# Patient Record
Sex: Male | Born: 1950 | Race: Black or African American | Hispanic: No | Marital: Single | Smoking: Never smoker
Health system: Southern US, Community
[De-identification: ages and names within clinical notes are randomized; demographics above are authoritative.]

---

## 2007-07-01 ENCOUNTER — Emergency Department (HOSPITAL_COMMUNITY): Admission: EM | Admit: 2007-07-01 | Discharge: 2007-07-01 | Payer: Self-pay | Admitting: Emergency Medicine

## 2007-09-22 ENCOUNTER — Emergency Department (HOSPITAL_COMMUNITY): Admission: EM | Admit: 2007-09-22 | Discharge: 2007-09-22 | Payer: Self-pay | Admitting: Emergency Medicine

## 2007-09-30 ENCOUNTER — Emergency Department (HOSPITAL_COMMUNITY): Admission: EM | Admit: 2007-09-30 | Discharge: 2007-09-30 | Payer: Self-pay | Admitting: Emergency Medicine

## 2007-11-26 ENCOUNTER — Inpatient Hospital Stay (HOSPITAL_COMMUNITY): Admission: EM | Admit: 2007-11-26 | Discharge: 2007-11-28 | Payer: Self-pay | Admitting: Emergency Medicine

## 2007-11-26 ENCOUNTER — Ambulatory Visit: Payer: Self-pay | Admitting: Infectious Diseases

## 2007-11-27 ENCOUNTER — Encounter (INDEPENDENT_AMBULATORY_CARE_PROVIDER_SITE_OTHER): Payer: Self-pay | Admitting: Emergency Medicine

## 2007-11-30 ENCOUNTER — Ambulatory Visit: Payer: Self-pay | Admitting: *Deleted

## 2007-12-19 ENCOUNTER — Ambulatory Visit: Payer: Self-pay | Admitting: Internal Medicine

## 2008-01-25 ENCOUNTER — Emergency Department (HOSPITAL_COMMUNITY): Admission: EM | Admit: 2008-01-25 | Discharge: 2008-01-25 | Payer: Self-pay | Admitting: Emergency Medicine

## 2008-02-05 ENCOUNTER — Ambulatory Visit: Payer: Self-pay | Admitting: Internal Medicine

## 2008-03-21 ENCOUNTER — Ambulatory Visit: Payer: Self-pay | Admitting: Internal Medicine

## 2008-04-06 ENCOUNTER — Encounter: Admission: RE | Admit: 2008-04-06 | Discharge: 2008-04-06 | Payer: Self-pay | Admitting: Diagnostic Radiology

## 2008-04-14 ENCOUNTER — Emergency Department (HOSPITAL_COMMUNITY): Admission: EM | Admit: 2008-04-14 | Discharge: 2008-04-14 | Payer: Self-pay | Admitting: Emergency Medicine

## 2008-05-10 ENCOUNTER — Ambulatory Visit: Payer: Self-pay | Admitting: Internal Medicine

## 2008-05-22 ENCOUNTER — Encounter: Admission: RE | Admit: 2008-05-22 | Discharge: 2008-07-25 | Payer: Self-pay | Admitting: Internal Medicine

## 2008-06-20 ENCOUNTER — Emergency Department (HOSPITAL_COMMUNITY): Admission: EM | Admit: 2008-06-20 | Discharge: 2008-06-20 | Payer: Self-pay | Admitting: Emergency Medicine

## 2008-08-12 ENCOUNTER — Ambulatory Visit: Payer: Self-pay | Admitting: Internal Medicine

## 2008-08-19 ENCOUNTER — Ambulatory Visit: Payer: Self-pay | Admitting: Internal Medicine

## 2008-08-22 ENCOUNTER — Ambulatory Visit: Payer: Self-pay | Admitting: Internal Medicine

## 2008-09-02 ENCOUNTER — Ambulatory Visit: Payer: Self-pay | Admitting: Internal Medicine

## 2008-09-19 ENCOUNTER — Ambulatory Visit: Payer: Self-pay | Admitting: Family Medicine

## 2008-11-04 ENCOUNTER — Ambulatory Visit: Payer: Self-pay | Admitting: Internal Medicine

## 2008-12-05 ENCOUNTER — Ambulatory Visit: Payer: Self-pay | Admitting: Internal Medicine

## 2008-12-05 LAB — CONVERTED CEMR LAB
AST: 32 units/L (ref 0–37)
Alkaline Phosphatase: 60 units/L (ref 39–117)
Basophils Relative: 0 % (ref 0–1)
CO2: 21 meq/L (ref 19–32)
Calcium: 10.1 mg/dL (ref 8.4–10.5)
Chloride: 101 meq/L (ref 96–112)
Cholesterol: 164 mg/dL (ref 0–200)
Hemoglobin: 15.1 g/dL (ref 13.0–17.0)
LDL Cholesterol: 98 mg/dL (ref 0–99)
MCHC: 31.9 g/dL (ref 30.0–36.0)
Monocytes Relative: 7 % (ref 3–12)
Neutrophils Relative %: 44 % (ref 43–77)
RBC: 4.82 M/uL (ref 4.22–5.81)
RDW: 13.8 % (ref 11.5–15.5)
TSH: 1.042 microintl units/mL (ref 0.350–4.50)
Total CHOL/HDL Ratio: 3.7
VLDL: 22 mg/dL (ref 0–40)

## 2009-03-14 ENCOUNTER — Emergency Department (HOSPITAL_COMMUNITY): Admission: EM | Admit: 2009-03-14 | Discharge: 2009-03-14 | Payer: Self-pay | Admitting: *Deleted

## 2009-03-18 ENCOUNTER — Ambulatory Visit: Payer: Self-pay | Admitting: Internal Medicine

## 2009-06-11 IMAGING — CT CT HEAD W/O CM
1 of 2 series · 16 of 30 positions shown, 20 images · non-contrast
Comparison: 07/01/2007

CLINICAL DATA: Headaches

CT HEAD WITHOUT CONTRAST
TECHNIQUE: Contiguous axial images were obtained from the base of
the skull through the vertex without contrast.

[Series 2: head routine 4.8 h37s · axial · 0.47mm/px · z∈[-118,+10]mm · 16 of 30 slices shown, 20 images]
[im 2/30  brain]
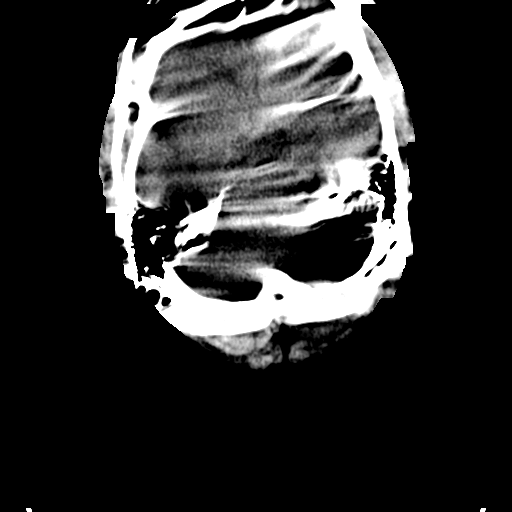
[im 2/30  bone]
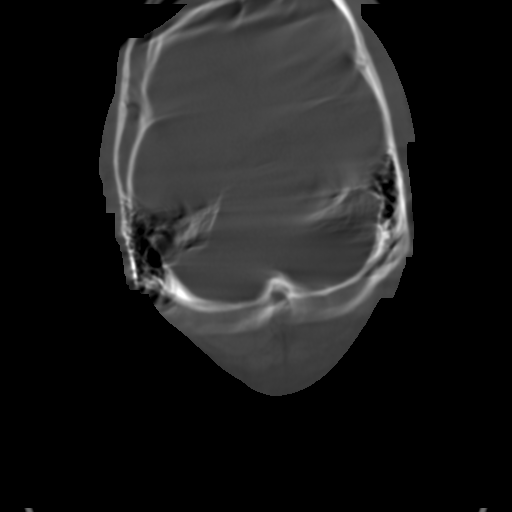
[im 3/30  brain]
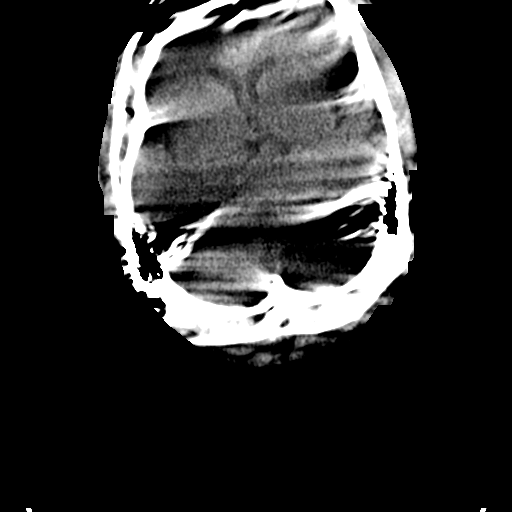
[im 5/30  brain]
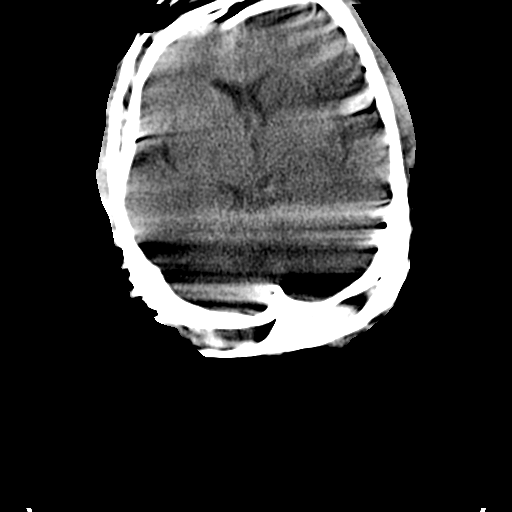
[im 8/30  brain]
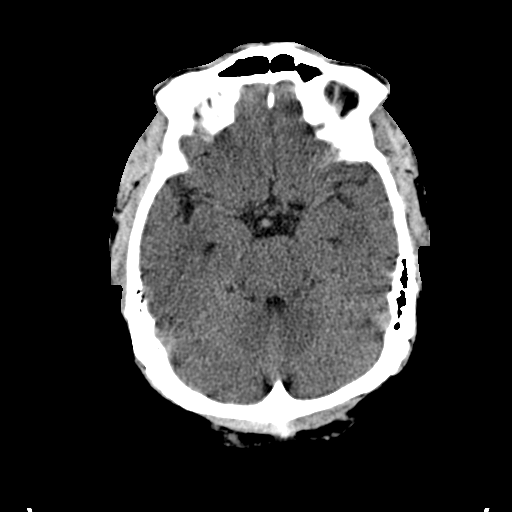
[im 9/30  brain]
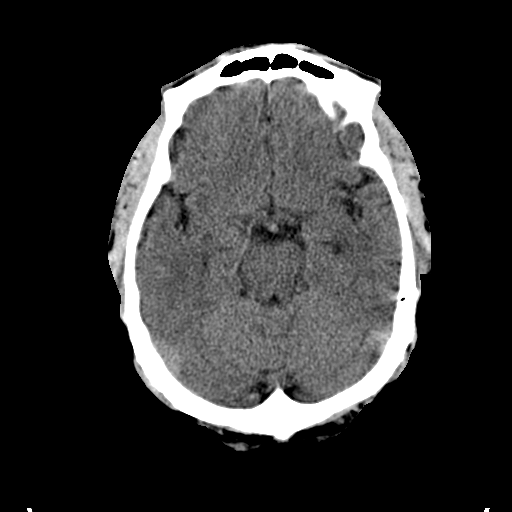
[im 9/30  bone]
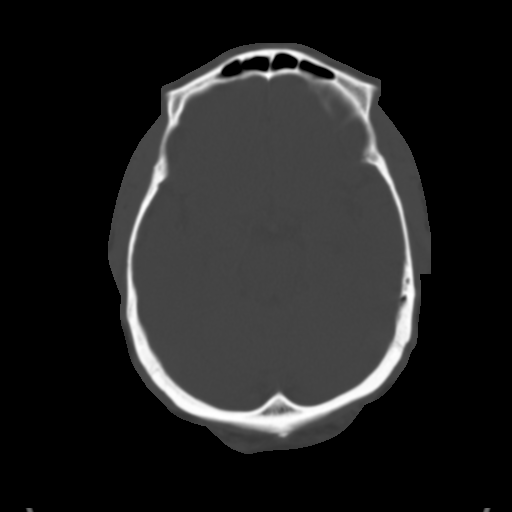
[im 11/30  brain]
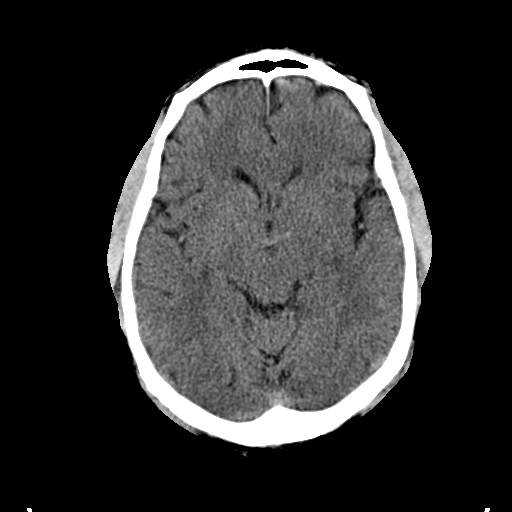
[im 12/30  brain]
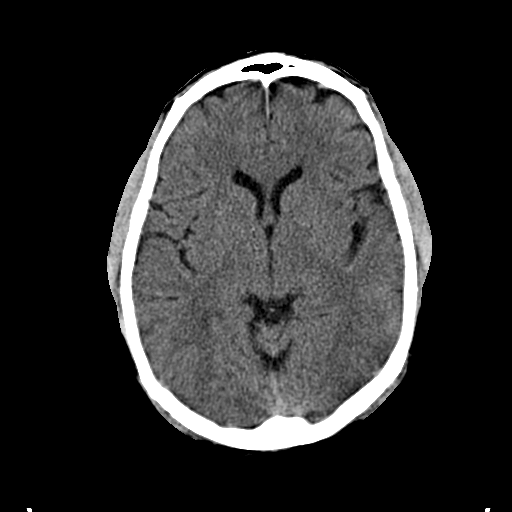
[im 14/30  brain]
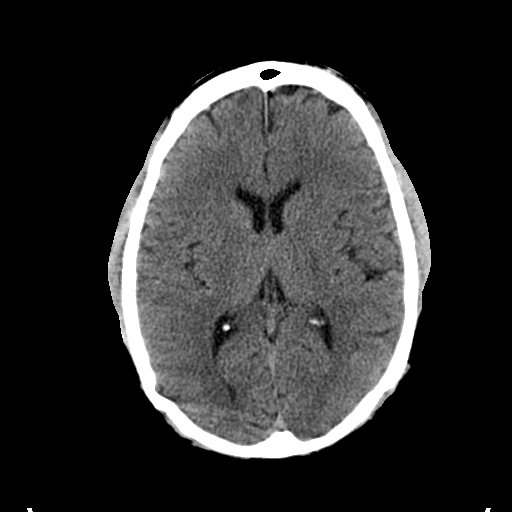
[im 16/30  brain]
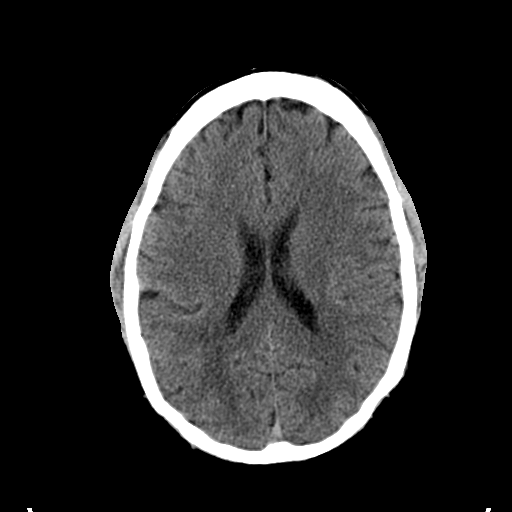
[im 16/30  bone]
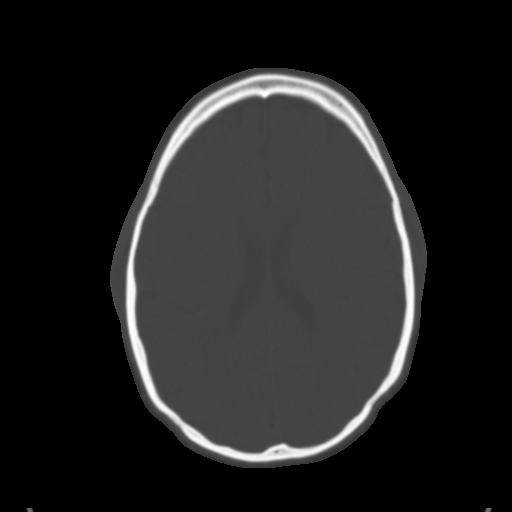
[im 18/30  brain]
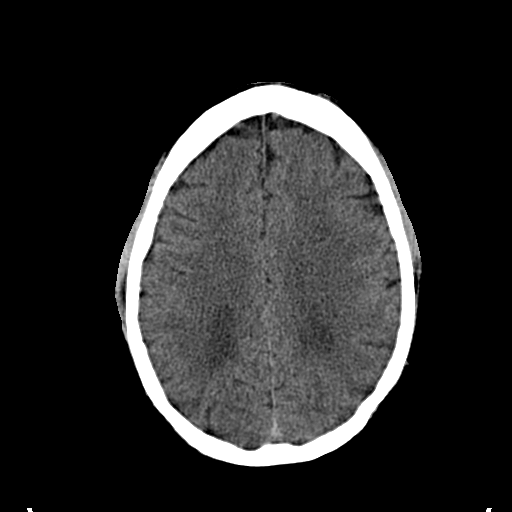
[im 19/30  brain]
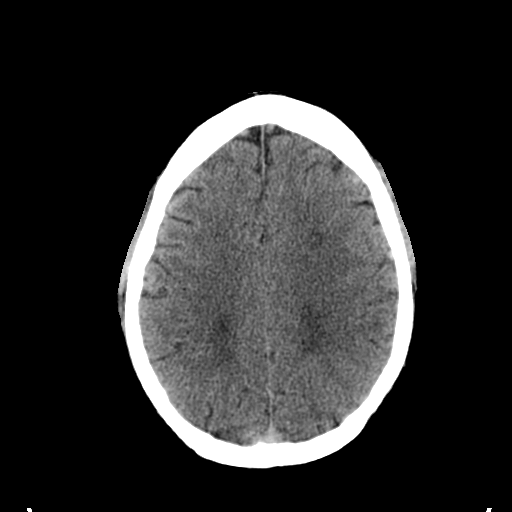
[im 21/30  brain]
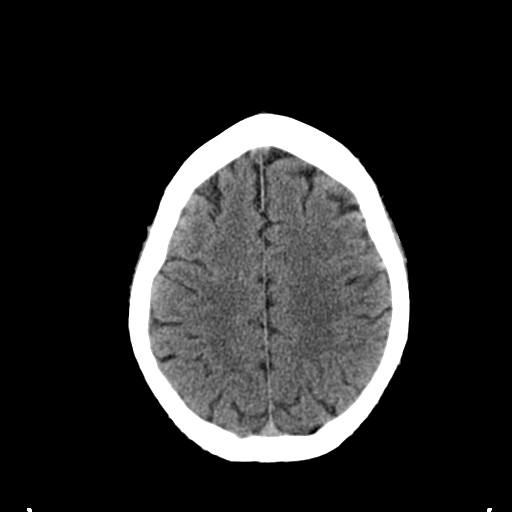
[im 22/30  brain]
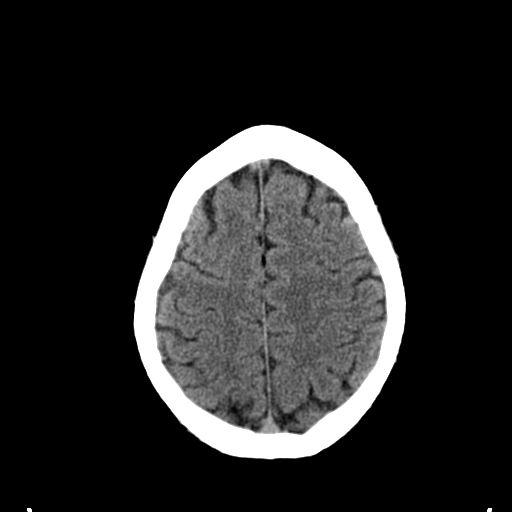
[im 22/30  bone]
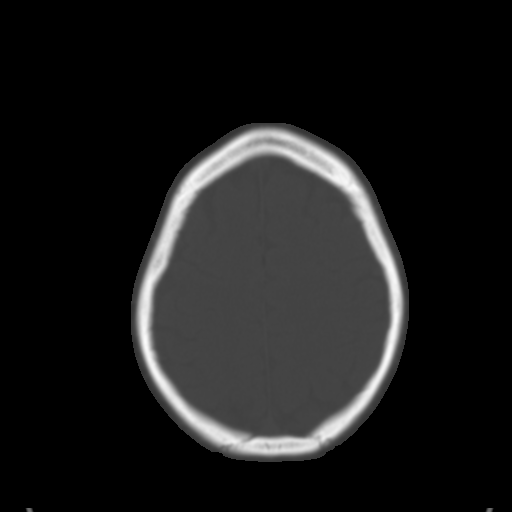
[im 25/30  brain]
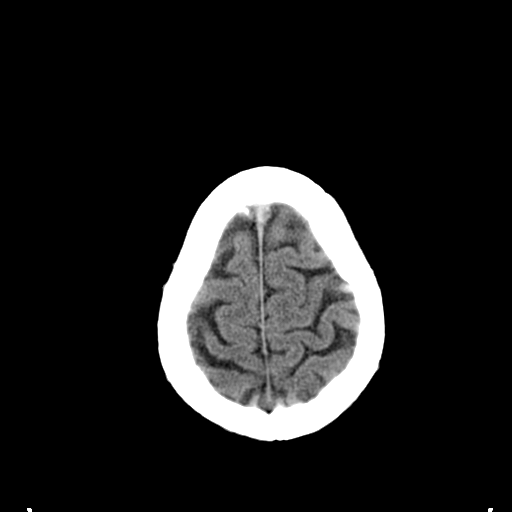
[im 27/30  brain]
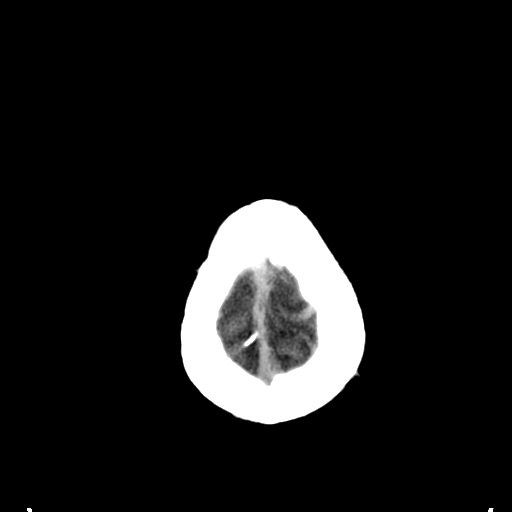
[im 28/30  brain]
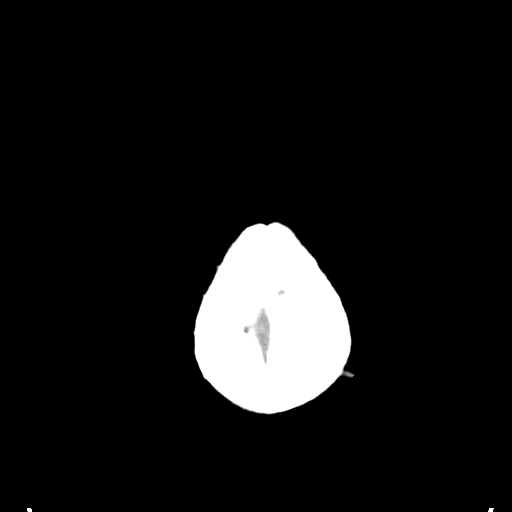

[16 of 30 positions shown; findings below may reference images not displayed]

FINDINGS: There is prominence of the sulci and ventricles
consistent with brain atrophy.

There is diffuse patchy low density throughout the subcortical and
periventricular white matter consistent with chronic small vessel
ischemic change.

There is no evidence for acute brain infarct, hemorrhage or mass.

The mastoid air cells are normally aerated.

The paranasal sinuses are normally aerated.

The skull is intact.
IMPRESSION: 1.  No acute intracranial abnormalities.

## 2010-03-05 IMAGING — CR DG CHEST 2V
2 series · 2 of 2 positions shown · non-contrast
Comparison: 11/27/2007.

CLINICAL DATA: Cough.  Short of breath.

CHEST - 2 VIEW

[w chest pa]
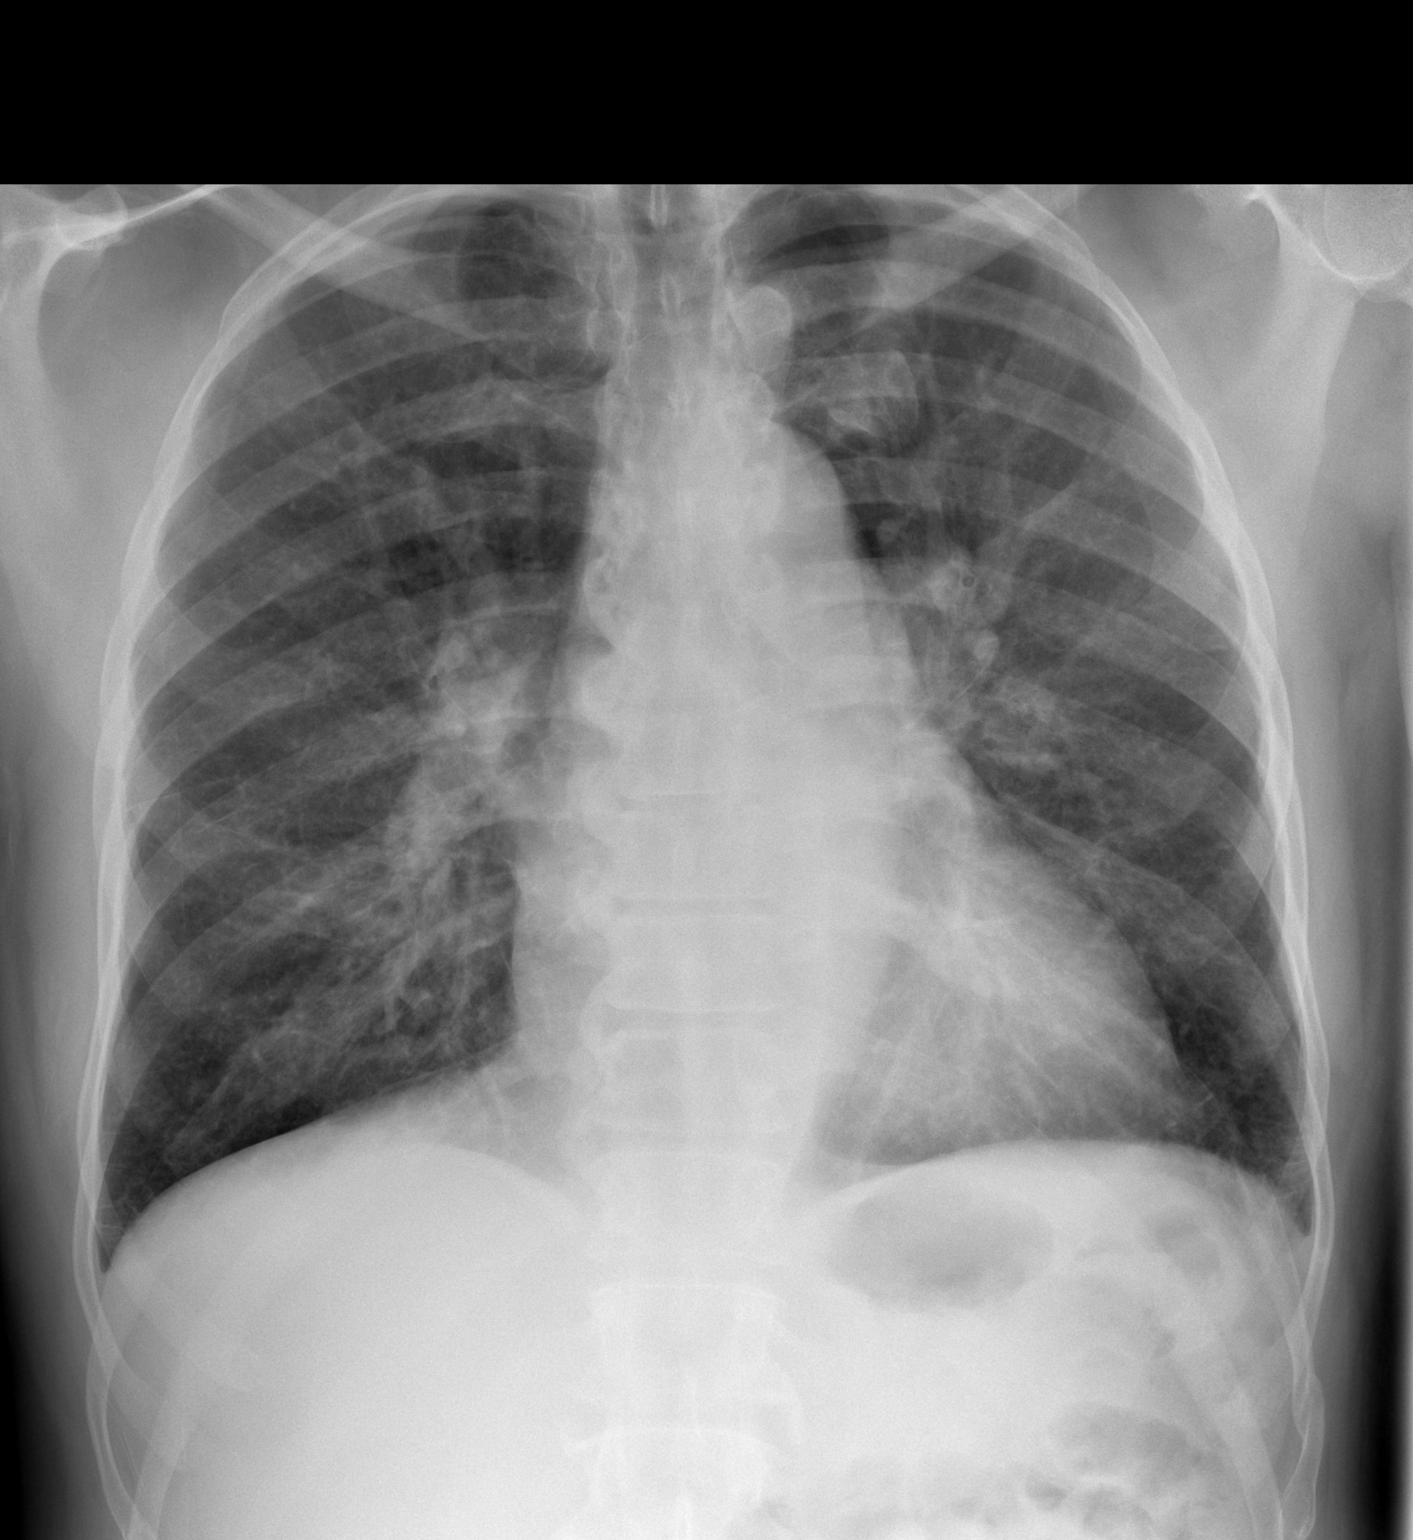

[w chest lat]
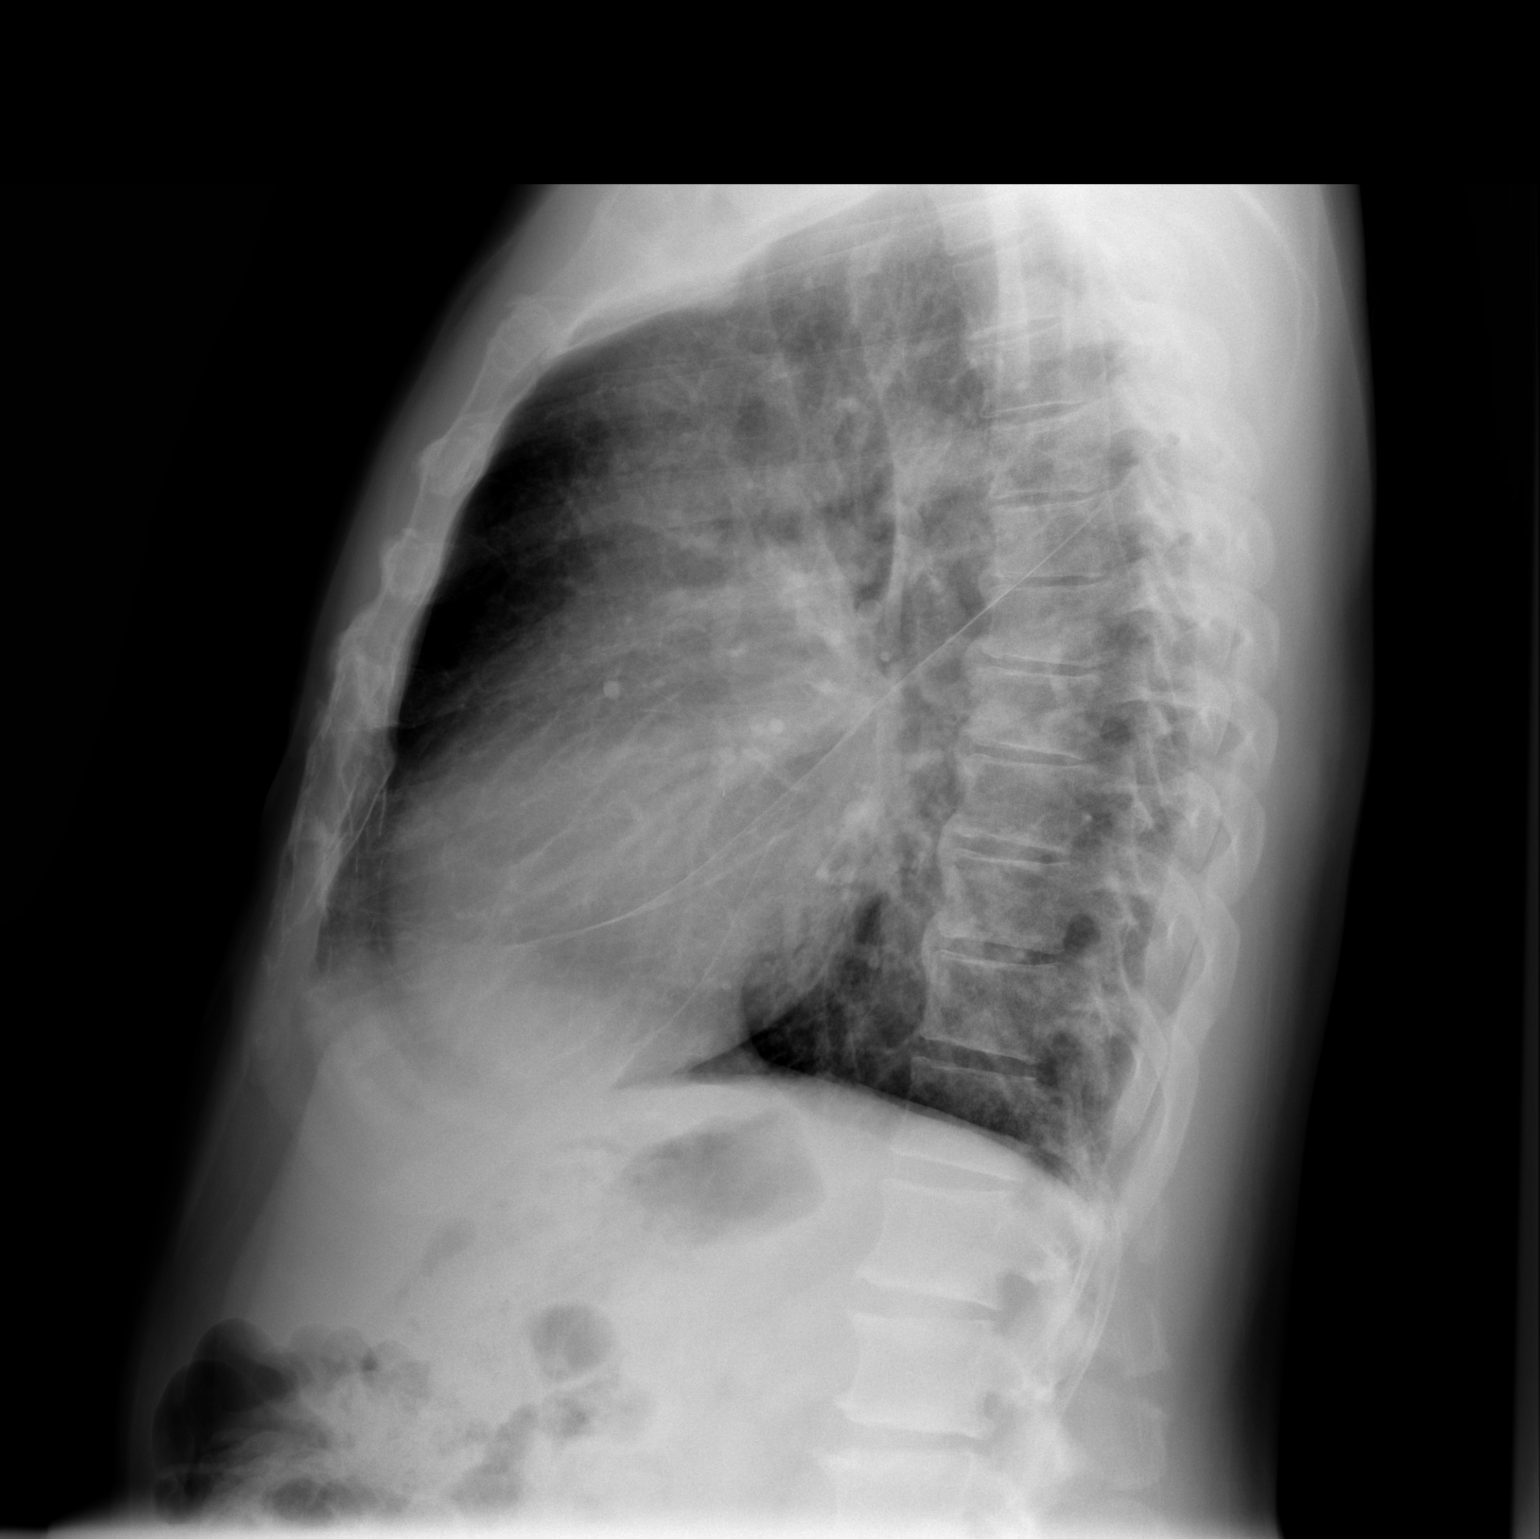

[2 of 2 positions shown; findings below may reference images not displayed]

FINDINGS: Moderate cardiomegaly is present.  Pulmonary vascular
congestion.  Mild bibasilar airspace disease as well as
interstitial pulmonary edema with Kerley B lines in the periphery.
Probable tiny bilateral pleural effusions.  Engorgement of the
pulmonary hila and widening of vascular pedicle.
IMPRESSION: Mild CHF.

## 2010-05-08 ENCOUNTER — Ambulatory Visit: Payer: Self-pay | Admitting: Nurse Practitioner

## 2010-11-22 ENCOUNTER — Encounter: Payer: Self-pay | Admitting: Family Medicine

## 2010-11-22 ENCOUNTER — Encounter: Payer: Self-pay | Admitting: Internal Medicine

## 2011-02-09 LAB — URINALYSIS, ROUTINE W REFLEX MICROSCOPIC
Glucose, UA: NEGATIVE mg/dL
Leukocytes, UA: NEGATIVE
Nitrite: NEGATIVE
Protein, ur: 30 mg/dL — AB
Urobilinogen, UA: 0.2 mg/dL (ref 0.0–1.0)

## 2011-02-09 LAB — BASIC METABOLIC PANEL
BUN: 9 mg/dL (ref 6–23)
CO2: 23 mEq/L (ref 19–32)
Calcium: 9.9 mg/dL (ref 8.4–10.5)
Chloride: 99 mEq/L (ref 96–112)
GFR calc Af Amer: >60 mL/min (ref >60)
Glucose, Bld: 131 mg/dL — ABNORMAL HIGH (ref 70–99)

## 2011-02-09 LAB — DIFFERENTIAL
Monocytes Absolute: 0.4 10*3/uL (ref 0.1–1.0)
Monocytes Relative: 8 % (ref 3–12)

## 2011-02-09 LAB — URINE MICROSCOPIC-ADD ON

## 2011-02-09 LAB — CBC
MCHC: 34.3 g/dL (ref 30.0–36.0)
MCV: 94 fL (ref 78.0–100.0)
RDW: 14.4 % (ref 11.5–15.5)

## 2011-02-09 LAB — POCT CARDIAC MARKERS
CKMB, poc: 1.8 ng/mL (ref 1.0–8.0)
Myoglobin, poc: 88 ng/mL (ref 12–200)
Troponin i, poc: <0.05 ng/mL (ref 0.00–0.09)

## 2011-02-09 LAB — MAGNESIUM: Magnesium: 2.2 mg/dL (ref 1.5–2.5)

## 2011-03-19 NOTE — Discharge Summary (Signed)
Gabriel Brown, SLEMMER NO.:  [id number]   MEDICAL RECORD NO.:  [id number]          PATIENT TYPE:  INP   LOCATION:  4707                         FACILITY:  [facility]   PHYSICIAN:  Lacretia Leigh. Hatcher, M.D.DATE OF BIRTH:  [birth year]   DATE OF ADMISSION:  11/26/2007  DATE OF DISCHARGE:  11/28/2007                               DISCHARGE SUMMARY   DISCHARGE DIAGNOSES:  1. Congestive heart failure exacerbation.  2. Hypertension.  3. Hypercholesterolemia.   DISCHARGE MEDICATIONS:  1. Lisinopril 20 mg p.o. daily.  2. Metoprolol 25 mg p.o. daily.  3. HCTZ 25 mg p.o. daily.  4. Lipitor 40 mg p.o. daily.  5. Aspirin 325 mg p.o. daily.   DISPOSITION/FOLLOWUP:  The patient is to follow up with [facility] on  December 19, 2007 at 9:15 a.m. in the morning.  At that time, patient is  to be reassessed for further resolution of his shortness of breath as  well as his hypertensive management.   PROCEDURES PERFORMED:  1. Chest x-ray revealed mild edema, represented by slight interstitial      prominence.  No focal disease.  2. EKG, first-degree AV block, normal sinus rhythm, biventricular      hypertrophy, biphasic P's in lead II, flat T's in V1-3, inverted      T's in V4-6.  3. A 2D echocardiogram:  Overall left ventricular systolic function is      moderately decreased.  Left ventricular ejection fraction was      estimated ranging between 30-35%.  There was moderate diffuse left      ventricular hypokinesis.  Left ventricular wall thickness was mild      to moderately increased.  Aortic valve thickness was mildly      increased, although there was moderate aortic valvular      regurgitation.  There was moderate mitral valvular regurgitation.      The left atrium was moderately dilated.  The right atrium was      moderately dilated.   CONSULTATIONS:  None.   BRIEF ADMITTING HISTORY AND PHYSICAL:  Patient is a 60 year old African-  American male with history of CHF with  progressive shortness of breath,  orthopnea, dyspnea on exertion, most pronounced over the last three  days.  He reports his symptoms as aggressive coughing with associated  nausea and vomiting.  He denies fevers, night sweats, diaphoresis, and  left-sided chest pain but does report a right-sided chest discomfort and  a new-onset wheeze.  He states that he has experienced multiple episodes  of this nature since this CHF diagnosis, which has required multiple  hospitalizations with this seeming to be the most severe.  He reports  that he is not taking any of his home medications since May or June of  this past year because he does not have them, but he has tried to get  into [facility] so as to further address his health problems.   ADMITTING LABS:  BNP 1088, creatinine 1.3.  UDS positive for THC.  Complete metabolic panel 136, 4.2, 105, 24, 13, 1.33, 97, bilirubin 1,  alk  phos 56, AST 74, ALT 87, protein 5.9, albumin 3.4, calcium 9.  CBC  5.4, hemoglobin 14.4, platelets 2007.  Point-of-care cardiac markers:  CK-MB 1.4, troponin less than 0.05, myoglobin 81.4, TSH 2.27.   HOSPITAL COURSE:  1. CHF exacerbation:  The patient was presented to the emergency      department with complaints of progressive shortness of breath and      orthopnea similar to previous CHF exacerbations.  Accompanying his      shortness of breath was mild nausea, vomiting, and cough.  He was      admitted for evaluation and for gentle diuresis.  Studies performed      throughout his hospitalization confirmed his congestive heart      failure and an EF of 30-35%.  Within the first 24 hours of his      hospital course, after having lost approximately 5 kg within the      first 24 hours, the patient felt essentially better.  He denied      shortness of breath or difficulty with lying on his back.  Chest x-      rays and echoes assured [place] of no acute changes in the patient's      condition and with continued fluid  restriction and diuresis, he was      discharged home in improved condition.   1. Hypertension:  On admission, the patient's blood pressure was      significantly elevated at 149/118.  At that time, he was given      labetalol 20 mg IV push with an evident decrease in his blood      pressure.  Throughout the remaining 24 hours, the patient did      require several 10 mg administrations of hydralazine in order to      regain proper control over his blood pressure.  Throughout his      course, the patient did not attest to any sequelae related to his      elevated blood pressure.  He denied any headaches or dizzy-type      symptoms.  Given that he had not been adherent to his previous      antihypertensive regimen at home, we were reluctant to restart his      beta blocker in the setting of a CHF exacerbation.  The patient,      however, was sent home with lisinopril 20, metoprolol 25, and HCTZ      as a means of addressing his blood pressure as an outpatient.  At      his followup with [facility], his hypertension management should      be reassessed.   DISCHARGE VITALS:  Vitals on the day of discharge, temperature 98.7,  systolic blood pressure 156, diastolic blood pressure 78, pulse 64,  respirations 18, satting at 97% on room air.   DISCHARGE LABS:  Complete metabolic panel:  Sodium 139, 3.5, 101, 28,  14, creatinine 1.51, glucose 99, total bilirubin 1.5, alkaline  phosphatase 57, AST 54, ALT 92, total protein 6.4, albumin 3.4, calcium  9.5.  Acute hepatitis panel was negative for hepatitis B surface  antigen, hepatitis B core antibody, hepatitis A antibody IgM, and  hepatitis C antibody.  HIV antibody was nonreactive.      Olene Craven, M.D.  Electronically Signed      Lacretia Leigh. Ninetta Lights, M.D.  Electronically Signed    [facility]  D:  12/01/2007  T:  12/01/2007  Job:  [id number]   cc:   [facility]

## 2011-07-22 LAB — HEPATITIS PANEL, ACUTE
HCV Ab: NEGATIVE
Hep A IgM: NEGATIVE
Hep B C IgM: NEGATIVE
Hepatitis B Surface Ag: NEGATIVE

## 2011-07-22 LAB — CBC
HCT: 42.7
Hemoglobin: 14.2
Hemoglobin: 14.4
MCHC: 33.2
MCV: 94.7
RBC: 4.51
WBC: 5.4

## 2011-07-22 LAB — DIFFERENTIAL
Eosinophils Absolute: 0
Lymphocytes Relative: 33
Monocytes Relative: 6

## 2011-07-22 LAB — URINE CULTURE: Colony Count: NO GROWTH

## 2011-07-22 LAB — TSH: TSH: 2.227

## 2011-07-22 LAB — COMPREHENSIVE METABOLIC PANEL
ALT: 100 — ABNORMAL HIGH
ALT: 92 — ABNORMAL HIGH
AST: 54 — ABNORMAL HIGH
Albumin: 3.4 — ABNORMAL LOW
Albumin: 3.4 — ABNORMAL LOW
BUN: 13
BUN: 16
CO2: 28
Calcium: 9
Calcium: 9.4
Calcium: 9.5
Creatinine, Ser: 1.33
GFR calc Af Amer: 59 — ABNORMAL LOW
GFR calc non Af Amer: 48 — ABNORMAL LOW
Glucose, Bld: 102 — ABNORMAL HIGH
Glucose, Bld: 97
Sodium: 139
Sodium: 142
Total Protein: 5.9 — ABNORMAL LOW
Total Protein: 6.4

## 2011-07-22 LAB — I-STAT 8, (EC8 V) (CONVERTED LAB)
Bicarbonate: 22.4
Chloride: 108
Operator id: 265201
Sodium: 139
pCO2, Ven: 34.9 — ABNORMAL LOW

## 2011-07-22 LAB — BASIC METABOLIC PANEL
Calcium: 9.5
Creatinine, Ser: 1.58 — ABNORMAL HIGH
GFR calc Af Amer: 56 — ABNORMAL LOW

## 2011-07-22 LAB — POCT CARDIAC MARKERS
Myoglobin, poc: 81.4
Operator id: 265201

## 2011-07-22 LAB — CARDIAC PANEL(CRET KIN+CKTOT+MB+TROPI)
CK, MB: 2.3
Relative Index: INVALID
Total CK: 98

## 2011-07-22 LAB — RAPID URINE DRUG SCREEN, HOSP PERFORMED
Amphetamines: NOT DETECTED
Barbiturates: NOT DETECTED
Benzodiazepines: NOT DETECTED

## 2011-07-22 LAB — CK TOTAL AND CKMB (NOT AT ARMC)
CK, MB: 3.1
Relative Index: 2.6 — ABNORMAL HIGH
Total CK: 118

## 2011-07-22 LAB — POCT I-STAT CREATININE
Creatinine, Ser: 1.3
Operator id: 265201

## 2011-07-22 LAB — LIPID PANEL
Cholesterol: 182
HDL: 29 — ABNORMAL LOW
LDL Cholesterol: 118 — ABNORMAL HIGH

## 2011-07-22 LAB — B-NATRIURETIC PEPTIDE (CONVERTED LAB): Pro B Natriuretic peptide (BNP): 1088 — ABNORMAL HIGH

## 2011-07-22 LAB — HIV ANTIBODY (ROUTINE TESTING W REFLEX): HIV: NONREACTIVE

## 2011-07-22 LAB — TROPONIN I: Troponin I: 0.05

## 2011-08-10 LAB — CBC
MCHC: 33.1
MCV: 93.6
RBC: 4.55

## 2011-08-10 LAB — I-STAT 8, (EC8 V) (CONVERTED LAB)
BUN: 13
BUN: 14
BUN: 18
Bicarbonate: 26.8 — ABNORMAL HIGH
Bicarbonate: 27.4 — ABNORMAL HIGH
Chloride: 107
Chloride: 108
Glucose, Bld: 94
HCT: 47
HCT: 48
Hemoglobin: 16.3
Operator id: 272551
Operator id: 272551
Potassium: 4.5
Sodium: 134 — ABNORMAL LOW
pCO2, Ven: 42.2 — ABNORMAL LOW
pH, Ven: 7.386 — ABNORMAL HIGH

## 2011-08-10 LAB — DIFFERENTIAL
Basophils Relative: 0
Eosinophils Absolute: 0.2
Eosinophils Relative: 3
Monocytes Absolute: 0.4
Monocytes Relative: 6
Neutrophils Relative %: 60

## 2011-08-10 LAB — POCT I-STAT CREATININE: Operator id: 270111

## 2011-08-13 LAB — RAPID STREP SCREEN (MED CTR MEBANE ONLY): Streptococcus, Group A Screen (Direct): NEGATIVE

## 2014-12-11 ENCOUNTER — Emergency Department (INDEPENDENT_AMBULATORY_CARE_PROVIDER_SITE_OTHER)
Admission: EM | Admit: 2014-12-11 | Discharge: 2014-12-11 | Disposition: A | Discharge status: Home / Self Care | Payer: Medicaid Other | Source: Home / Self Care | Attending: Family Medicine | Admitting: Family Medicine

## 2014-12-11 ENCOUNTER — Encounter (HOSPITAL_COMMUNITY): Payer: Self-pay | Admitting: Emergency Medicine

## 2014-12-11 DIAGNOSIS — K047 Periapical abscess without sinus: Secondary | ICD-10-CM

## 2014-12-11 DIAGNOSIS — K0889 Other specified disorders of teeth and supporting structures: Secondary | ICD-10-CM

## 2014-12-11 DIAGNOSIS — K088 Other specified disorders of teeth and supporting structures: Secondary | ICD-10-CM

## 2014-12-11 MED ORDER — AMOXICILLIN 500 MG PO CAPS: ORAL_CAPSULE | ORAL | Status: AC

## 2014-12-11 MED ORDER — HYDROCODONE-ACETAMINOPHEN 7.5-325 MG PO TABS: 1.0000 | ORAL_TABLET | ORAL | Status: AC | PRN

## 2014-12-11 NOTE — Discharge Instructions (Signed)
Dental Abscess °A dental abscess is a collection of infected fluid (pus) from a bacterial infection in the inner part of the tooth (pulp). It usually occurs at the end of the tooth's root.  °CAUSES  °· Severe tooth decay. °· Trauma to the tooth that allows bacteria to enter into the pulp, such as a broken or chipped tooth. °SYMPTOMS  °· Severe pain in and around the infected tooth. °· Swelling and redness around the abscessed tooth or in the mouth or face. °· Tenderness. °· Pus drainage. °· Bad breath. °· Bitter taste in the mouth. °· Difficulty swallowing. °· Difficulty opening the mouth. °· Nausea. °· Vomiting. °· Chills. °· Swollen neck glands. °DIAGNOSIS  °· A medical and dental history will be taken. °· An examination will be performed by tapping on the abscessed tooth. °· X-rays may be taken of the tooth to identify the abscess. °TREATMENT °The goal of treatment is to eliminate the infection. You may be prescribed antibiotic medicine to stop the infection from spreading. A root canal may be performed to save the tooth. If the tooth cannot be saved, it may be pulled (extracted) and the abscess may be drained.  °HOME CARE INSTRUCTIONS °· Only take over-the-counter or prescription medicines for pain, fever, or discomfort as directed by your caregiver. °· Rinse your mouth (gargle) often with salt water (¼ tsp salt in 8 oz [250 ml] of warm water) to relieve pain or swelling. °· Do not drive after taking pain medicine (narcotics). °· Do not apply heat to the outside of your face. °· Return to your dentist for further treatment as directed. °SEEK MEDICAL CARE IF: °· Your pain is not helped by medicine. °· Your pain is getting worse instead of better. °SEEK IMMEDIATE MEDICAL CARE IF: °· You have a fever or persistent symptoms for more than 2-3 days. °· You have a fever and your symptoms suddenly get worse. °· You have chills or a very bad headache. °· You have problems breathing or swallowing. °· You have trouble  opening your mouth. °· You have swelling in the neck or around the eye. °Document Released: 10/18/2005 Document Revised: 07/12/2012 Document Reviewed: 01/26/2011 °Exit[employer]® Patient Information ©2015 [employer]. This information is not intended to replace advice given to you by your health care provider. Make sure you discuss any questions you have with your health care provider. ° °Dental Pain °A tooth ache may be caused by cavities (tooth decay). Cavities expose the nerve of the tooth to air and hot or cold temperatures. It may come from an infection or abscess (also called a boil or furuncle) around your tooth. It is also often caused by dental caries (tooth decay). This causes the pain you are having. °DIAGNOSIS  °Your caregiver can diagnose this problem by exam. °TREATMENT  °· If caused by an infection, it may be treated with medications which kill germs (antibiotics) and pain medications as prescribed by your caregiver. Take medications as directed. °· Only take over-the-counter or prescription medicines for pain, discomfort, or fever as directed by your caregiver. °· Whether the tooth ache today is caused by infection or dental disease, you should see your dentist as soon as possible for further care. °SEEK MEDICAL CARE IF: °The exam and treatment you received today has been provided on an emergency basis only. This is not a substitute for complete medical or dental care. If your problem worsens or new problems (symptoms) appear, and you are unable to meet with your dentist, call or   return to this location. °SEEK IMMEDIATE MEDICAL CARE IF:  °· You have a fever. °· You develop redness and swelling of your face, jaw, or neck. °· You are unable to open your mouth. °· You have severe pain uncontrolled by pain medicine. °MAKE SURE YOU:  °· Understand these instructions. °· Will watch your condition. °· Will get help right away if you are not doing well or get worse. °Document Released: 10/18/2005 Document  Revised: 01/10/2012 Document Reviewed: 06/05/2008 °ExitCare® Patient Information ©2015 [employer]. This information is not intended to replace advice given to you by your health care provider. Make sure you discuss any questions you have with your health care provider. ° °Dental Care and Dentist Visits °Dental care supports good overall health. Regular dental visits can also help you avoid dental pain, bleeding, infection, and other more serious health problems in the future. It is important to keep the mouth healthy because diseases in the teeth, gums, and other oral tissues can spread to other areas of the body. Some problems, such as diabetes, heart disease, and pre-term labor have been associated with poor oral health.  °See your dentist every 6 months. If you experience emergency problems such as a toothache or broken tooth, go to the dentist right away. If you see your dentist regularly, you may catch problems early. It is easier to be treated for problems in the early stages.  °WHAT TO EXPECT AT A DENTIST VISIT  °Your dentist will look for many common oral health problems and recommend proper treatment. At your regular dental visit, you can expect: °· Gentle cleaning of the teeth and gums. This includes scraping and polishing. This helps to remove the sticky substance around the teeth and gums (plaque). Plaque forms in the mouth shortly after eating. Over time, plaque hardens on the teeth as tartar. If tartar is not removed regularly, it can cause problems. Cleaning also helps remove stains. °· Periodic X-rays. These pictures of the teeth and supporting bone will help your dentist assess the health of your teeth. °· Periodic fluoride treatments. Fluoride is a natural mineral shown to help strengthen teeth. Fluoride treatment involves applying a fluoride gel or varnish to the teeth. It is most commonly done in children. °· Examination of the mouth, tongue, jaws, teeth, and gums to look for any oral health  problems, such as: °¨ Cavities (dental caries). This is decay on the tooth caused by plaque, sugar, and acid in the mouth. It is best to catch a cavity when it is small. °¨ Inflammation of the gums caused by plaque buildup (gingivitis). °¨ Problems with the mouth or malformed or misaligned teeth. °¨ Oral cancer or other diseases of the soft tissues or jaws.  °KEEP YOUR TEETH AND GUMS HEALTHY °For healthy teeth and gums, follow these general guidelines as well as your dentist's specific advice: °· Have your teeth professionally cleaned at the dentist every 6 months. °· Brush twice daily with a fluoride toothpaste. °· Floss your teeth daily.  °· Ask your dentist if you need fluoride supplements, treatments, or fluoride toothpaste. °· Eat a healthy diet. Reduce foods and drinks with added sugar. °· Avoid smoking. °TREATMENT FOR ORAL HEALTH PROBLEMS °If you have oral health problems, treatment varies depending on the conditions present in your teeth and gums. °· Your caregiver will most likely recommend good oral hygiene at each visit. °· For cavities, gingivitis, or other oral health disease, your caregiver will perform a procedure to treat the problem. This is typically done at   a separate appointment. Sometimes your caregiver will refer you to another dental specialist for specific tooth problems or for surgery. °SEEK IMMEDIATE DENTAL CARE IF: °· You have pain, bleeding, or soreness in the gum, tooth, jaw, or mouth area. °· A permanent tooth becomes loose or separated from the gum socket. °· You experience a blow or injury to the mouth or jaw area. °Document Released: 06/30/2011 Document Revised: 01/10/2012 Document Reviewed: 06/30/2011 °ExitCare® Patient Information ©2015 [employer]. This information is not intended to replace advice given to you by your health care provider. Make sure you discuss any questions you have with your health care provider. ° °

## 2014-12-11 NOTE — ED Provider Notes (Signed)
CSN: [id number]     Arrival date & time 12/11/14  1405 History   First MD Initiated Contact with Patient 12/11/14 1521     Chief Complaint  Patient presents with  . Dental Pain   (Consider location/radiation/quality/duration/timing/severity/associated sxs/prior Treatment) HPI Comments: 64 year old male complaining of right lower third molar toothache for 17 days. He states he has an appointment with his dentist in 5 days.   History reviewed. No pertinent past medical history. History reviewed. No pertinent past surgical history. No family history on file. History  Substance Use Topics  . Smoking status: Never Smoker   . Smokeless tobacco: Not on file  . Alcohol Use: Yes    Review of Systems  Constitutional: Negative.   HENT: Positive for dental problem.   All other systems reviewed and are negative.   Allergies  Review of patient's allergies indicates no known allergies.  Home Medications   Prior to Admission medications   Medication Sig Start Date End Date Taking? Authorizing Provider  AMLODIPINE BESY-BENAZEPRIL HCL PO Take by mouth.   Yes Historical Provider, MD  ATORVASTATIN CALCIUM PO Take by mouth.   Yes Historical Provider, MD  LISINOPRIL PO Take by mouth.   Yes Historical Provider, MD  METOPROLOL TARTRATE PO Take by mouth.   Yes Historical Provider, MD  amoxicillin (AMOXIL) 500 MG capsule 2 caps bid x 7 d 12/11/14   Hayden Rasmussenavid Lindsey Demonte, NP  HYDROcodone-acetaminophen (NORCO) 7.5-325 MG per tablet Take 1 tablet by mouth every 4 (four) hours as needed. 12/11/14   Hayden Rasmussenavid Jonaya Freshour, NP   BP 152/96 mmHg  Pulse 109  Temp(Src) 98.3 F (36.8 C) (Oral)  Resp 16  SpO2 97% Physical Exam  Constitutional: He is oriented to person, place, and time. He appears well-developed and well-nourished. No distress.  HENT:  Mouth/Throat: Oropharynx is clear and moist.  There is abscess formation and swelling to the buccal mucosa adjacent to the right lower third molars. There are 2 visible. There is  no drainage. There is some gingival swelling and erythema. No direct dental tenderness.  Pulmonary/Chest: Effort normal. No respiratory distress.  Neurological: He is alert and oriented to person, place, and time. He exhibits normal muscle tone.  Skin: Skin is warm and dry.  Psychiatric: He has a normal mood and affect.  Nursing note and vitals reviewed.   ED Course  Procedures (including critical care time) Labs Review Labs Reviewed - No data to display  Imaging Review No results found.   MDM   1. Toothache   2. Dental abscess      Norco 7.5 mg #20 Amoxicillin as directed Ibuprofen for pain as directed Warm salt water rinses Keep appointment with dentist in 5 days as scheduled      Hayden Rasmussenavid Brooke Payes, NP 12/11/14 1533

## 2014-12-11 NOTE — ED Notes (Signed)
C/o lower right dental pain onset 3 weeks Has appt w/dentist on Monday Denies fevers, chills Alert, no signs of acute distress.

## 2019-12-31 DEATH — deceased
